# Patient Record
Sex: Male | Born: 1950 | Race: White | Hispanic: No | Marital: Married | Smoking: Former smoker
Health system: Southern US, Community
[De-identification: ages and names within clinical notes are randomized; demographics above are authoritative.]

## PROBLEM LIST (undated history)

## (undated) DIAGNOSIS — M1990 Unspecified osteoarthritis, unspecified site: Secondary | ICD-10-CM

## (undated) DIAGNOSIS — F329 Major depressive disorder, single episode, unspecified: Secondary | ICD-10-CM

## (undated) DIAGNOSIS — E785 Hyperlipidemia, unspecified: Secondary | ICD-10-CM

## (undated) DIAGNOSIS — I2510 Atherosclerotic heart disease of native coronary artery without angina pectoris: Secondary | ICD-10-CM

## (undated) DIAGNOSIS — G4730 Sleep apnea, unspecified: Secondary | ICD-10-CM

## (undated) DIAGNOSIS — R0902 Hypoxemia: Secondary | ICD-10-CM

## (undated) DIAGNOSIS — J449 Chronic obstructive pulmonary disease, unspecified: Secondary | ICD-10-CM

## (undated) DIAGNOSIS — F32A Depression, unspecified: Secondary | ICD-10-CM

## (undated) DIAGNOSIS — I10 Essential (primary) hypertension: Secondary | ICD-10-CM

## (undated) DIAGNOSIS — I509 Heart failure, unspecified: Secondary | ICD-10-CM

## (undated) DIAGNOSIS — K219 Gastro-esophageal reflux disease without esophagitis: Secondary | ICD-10-CM

## (undated) HISTORY — DX: Hypoxemia: R09.02

## (undated) HISTORY — DX: Unspecified osteoarthritis, unspecified site: M19.90

## (undated) HISTORY — DX: Gastro-esophageal reflux disease without esophagitis: K21.9

## (undated) HISTORY — DX: Atherosclerotic heart disease of native coronary artery without angina pectoris: I25.10

## (undated) HISTORY — DX: Sleep apnea, unspecified: G47.30

## (undated) HISTORY — DX: Major depressive disorder, single episode, unspecified: F32.9

## (undated) HISTORY — DX: Hemochromatosis, unspecified: E83.119

## (undated) HISTORY — DX: Essential (primary) hypertension: I10

## (undated) HISTORY — DX: Chronic obstructive pulmonary disease, unspecified: J44.9

## (undated) HISTORY — DX: Heart failure, unspecified: I50.9

## (undated) HISTORY — DX: Hyperlipidemia, unspecified: E78.5

## (undated) HISTORY — DX: Depression, unspecified: F32.A

---
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
## 1993-09-18 HISTORY — PX: BACK SURGERY: SHX140

## 2007-09-19 HISTORY — PX: CORONARY ANGIOPLASTY WITH STENT PLACEMENT: SHX49

## 2009-02-09 ENCOUNTER — Encounter: Payer: Self-pay | Admitting: Internal Medicine

## 2009-02-11 ENCOUNTER — Encounter: Payer: Self-pay | Admitting: Internal Medicine

## 2009-11-08 ENCOUNTER — Encounter: Payer: Self-pay | Admitting: Internal Medicine

## 2010-01-26 ENCOUNTER — Encounter: Payer: Self-pay | Admitting: Internal Medicine

## 2010-07-18 ENCOUNTER — Inpatient Hospital Stay (HOSPITAL_COMMUNITY): Admission: AD | Admit: 2010-07-18 | Discharge: 2010-07-22 | Payer: Self-pay | Admitting: Internal Medicine

## 2010-07-19 ENCOUNTER — Encounter (INDEPENDENT_AMBULATORY_CARE_PROVIDER_SITE_OTHER): Payer: Self-pay | Admitting: Internal Medicine

## 2010-08-02 ENCOUNTER — Encounter: Payer: Self-pay | Admitting: Internal Medicine

## 2010-08-17 ENCOUNTER — Ambulatory Visit: Payer: Self-pay | Admitting: Thoracic Surgery

## 2010-08-17 DIAGNOSIS — J449 Chronic obstructive pulmonary disease, unspecified: Secondary | ICD-10-CM

## 2010-08-17 DIAGNOSIS — J4489 Other specified chronic obstructive pulmonary disease: Secondary | ICD-10-CM | POA: Insufficient documentation

## 2010-08-17 DIAGNOSIS — J984 Other disorders of lung: Secondary | ICD-10-CM | POA: Insufficient documentation

## 2010-08-17 DIAGNOSIS — Z862 Personal history of diseases of the blood and blood-forming organs and certain disorders involving the immune mechanism: Secondary | ICD-10-CM

## 2010-08-18 ENCOUNTER — Ambulatory Visit: Payer: Self-pay | Admitting: Internal Medicine

## 2010-08-18 DIAGNOSIS — F17200 Nicotine dependence, unspecified, uncomplicated: Secondary | ICD-10-CM

## 2010-08-22 ENCOUNTER — Ambulatory Visit (HOSPITAL_COMMUNITY)
Admission: RE | Admit: 2010-08-22 | Discharge: 2010-08-22 | Payer: Self-pay | Source: Home / Self Care | Admitting: Thoracic Surgery

## 2010-08-30 ENCOUNTER — Ambulatory Visit: Payer: Self-pay | Admitting: Thoracic Surgery

## 2010-09-26 ENCOUNTER — Ambulatory Visit: Admit: 2010-09-26 | Payer: Self-pay | Admitting: Internal Medicine

## 2010-10-18 NOTE — Assessment & Plan Note (Signed)
Summary: copd ///kp   Visit Type:  Initial Consult Copy to:  Dr. Pearson Grippe Primary Provider/Referring Provider:  Dr. Pearson Grippe, [facility]  CC:  Pulmonary Consult.  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 Heavy smoker and daily drinker of beer. CAD on ACE inhibitor and ATENOLOL non specific beta blocker.   Established care with Dr. Pearson Grippe recently following admission 07/18/2010 through 07/22/2010 for AECOPD/mild diastolic CHF. Dr Selena Batten has now referrhed him here for COPD mgmt. Patient also has pulmnary nodules (likely benign, see past med) for which Dr. Edwyna Shell is following.   Patient does not know for sure why he is here with pulmonary but suspects it is for COPD. States he has chronci dyspea for few years. Insidious onset. Dyspnea since MI in 2009 and s.p stent in [place], [place]. No associated chest pain currently. Typically gets dyspneic only after walking 1/2 mile at slow pace but only 100 yards at fast pace or hard work in a 'hurry'. Has occ chronic dry cough at baseline.  Denies hemoptysis, edema, symcope.      Preventive Screening-Counseling & Management  Alcohol-Tobacco     Alcohol drinks/day: >5     Alcohol type: beer     >5/day in last 3 mos: yes     Feels need to cut down: no     Feels annoyed by complaints: yes     Feels guilty re: drinking: yes     Needs 'eye opener' in am: no     Smoking Status: current     Smoking Cessation Counseling: yes     Smoke Cessation Stage: contemplative     Packs/Day: 2.5     Year Started: 1969     Tobacco Counseling: to quit use of tobacco products  Comments: finding it tough to quit  Current Medications (verified): 1)  Nitrostat 0.4 Mg Subl (Nitroglycerin) .... As Needed 2)  Spiriva Handihaler 18 Mcg Caps (Tiotropium Bromide Monohydrate) .... Once Daily 3)  Atenolol 50 Mg Tabs (Atenolol) .... Once Daily 4)   Buspirone Hcl 15 Mg Tabs (Buspirone Hcl) .... Once Daily 5)  Aspirin 81 Mg Tabs (Aspirin) .... Once Daily 6)  Folic Acid 800 Mcg Tabs (Folic Acid) .... Once Daily 7)  Prozac 20 Mg Caps (Fluoxetine Hcl) .... Once Daily 8)  Oxygen .... 2 Liters At Bedtime 9)  Lisinopril 5 Mg Tabs (Lisinopril) .... Once Daily 10)  Xopenex 0.63 Mg/53ml Nebu (Levalbuterol Hcl) .... Once A Day Per Pt  Allergies (verified): No Known Drug Allergies  Past History:  Past Medical History: Current Problems:  #HEMOCHROMATOSIS, HX OF (ICD-V12.3)... dr Arty Baumgartner #PULMONARY NODULE [phone])....Marland KitchenMarland KitchenDr Edwyna Shell  - CT at [facility]  07/20/2008 per report/no image: 4mm RML nodule, 8mm RUL nodules  - CT at [facility] 02/09/2009 - per report/no image: same  - PET scan at [facility] 5/28/201- - per report/no image: NEGATIVE   [facility]:   8 x 8 mm diameter right upper lobe pulmonary nodule   image 41, noncalcified.         Tiny calcified granuloma subpleural right lower lobe image 48  #HILAR NODES   CT 07/18/2010 at [facility]  right hilar lymph nodes, 2.8 x 1.8 cm image 49   and 1.4 x 1.1 cm image 58. #COPD (ICD-496)  - seen on CT 07/18/2010 #CAD---POST STENT IN 2009 (ICD-414.00).Marland KitchenMarland Kitchen  Dr Lavella Hammock  #HEIGHT LOSS at [facility]  - seen on CT 07/18/2010  Past Pulmonary History:  Pulmonary History:  DATE OF ADMISSION:  07/18/2010   DATE OF DISCHARGE:  07/22/2010     1. Chronic obstructive pulmonary disease exacerbation.        - BNP 400, ECHO ef 60%, Gr 1 diastolicy dysfn  2. Lower extremity edema.   3. Coronary artery disease status post stent in 2009.   4. Chronic obstructive pulmonary disease, on home O2 at night.   5. Hemochromatosis.   6. Negative colonoscopy in 2007 per patient.   7. Back surgery in 1995, [facility].   8. Appendectomy in 1969.   Family History: Family History Hypertension---sister Family History Diabetes---brother, paternal grandparents Kidney failure---brother Father  died age 36 in MVA Family History Hyperlipidemia---sister COPD - 2 brothers  Social History: Patient is a current smoker. Started in 1969 was smokong 2.5ppd, now pt at 1/2ppd x 1 week.  Drinks 30 beers per week Pipe fitter---retired Married Moved form [place] to [place] in Fall 2011Alcohol drinks/day:  >5 Packs/Day:  2.5 >5/day in last 3 mos:  yes  Review of Systems       The patient complains of shortness of breath with activity, shortness of breath at rest, non-productive cough, indigestion, weight change, nasal congestion/difficulty breathing through nose, ear ache, depression, hand/feet swelling, joint stiffness or pain, and rash.  The patient denies productive cough, coughing up blood, chest pain, irregular heartbeats, acid heartburn, loss of appetite, abdominal pain, difficulty swallowing, sore throat, tooth/dental problems, headaches, sneezing, itching, anxiety, change in color of mucus, and fever.    Vital Signs:  Patient profile:   60 year old male Height:      71 inches Weight:      210.38 pounds BMI:     29.45 O2 Sat:      92 % on Room air Temp:     98.0 degrees F oral Pulse rate:   83 / minute BP sitting:   128 / 90  (right arm) Cuff size:   regular  Vitals Entered By: Carron Curie CMA (August 18, 2010 11:02 AM)  O2 Flow:  Room air CC: Pulmonary Consult Comments Medications reviewed with patient Carron Curie CMA  August 18, 2010 11:08 AM Daytime phone number verified with patient.    Physical Exam  General:  well developed, well nourished, in no acute distress Head:  normocephalic and atraumatic Eyes:  PERRLA/EOM intact; conjunctiva and sclera clear Ears:  TMs intact and clear with normal canals Nose:  no deformity, discharge, inflammation, or lesions Mouth:  no deformity or lesions Neck:  no masses, thyromegaly, or abnormal cervical nodes Chest Wall:  no deformities noted Lungs:  clear bilaterally to auscultation and percussion Heart:   regular rate and rhythm, S1, S2 without murmurs, rubs, gallops, or clicks Abdomen:  bowel sounds positive; abdomen soft and non-tender without masses, or organomegaly Msk:  no deformity or scoliosis noted with normal posture Pulses:  pulses normal Extremities:  no clubbing, cyanosis, edema, or deformity noted Neurologic:  CN II-XII grossly intact with normal reflexes, coordination, muscle strength and tone Skin:  intact without lesions or rashes Cervical Nodes:  no significant adenopathy Axillary Nodes:  no significant adenopathy Psych:  alert and cooperative; normal mood and affect; normal attention span and concentration   CT of Chest  Procedure date:  07/18/2010  Findings:      see past hx. I personally reviewed these scans  MISC. Report  Procedure date:  06/09/2010  Findings:      wc 9.5 hgb 17gm% creat 0.7mg %  Impression & Recommendations:  Problem # 1:  TOBACCO ABUSE (ICD-305.1) Assessment New  advised to quit. He states it will be difificult. Wil address more at followup  Orders: Prescription Created Electronically [phone]) Consultation Level V [id number])  Problem # 2:  C O P D (ICD-496) Assessment: New History, CT findingsaall shows very high pretest probabiliyt for COPD. SEe on CT scan as well.  plan get pfts to asess spirometry change non specific beta blocker atenolol to beta 1 specific lopressor change lisinopril to losartan ARB on account of chronic cough rov after pft get alpha 1 at followup contnue spiriva for now address rehab at [facility] flu shot advised (he had it already)  Medications Added to Medication List This Visit: 1)  Xopenex 0.63 Mg/11ml Nebu (Levalbuterol hcl) .... Once a day per pt 2)  Metoprolol Tartrate 25 Mg Tabs (Metoprolol tartrate) .... Take 1 tablet two times a day 3)  Metoprolol Tartrate 25 Mg Tabs (Metoprolol tartrate) .... Take 1/2  tablet two times a day 4)  Losartan Potassium 25 Mg Tabs (Losartan potassium) .... Take 1 tablet  daily  Patient Instructions: 1)  #Please STOP your ATENOLOL HEART MEDICAITON 2)     - Instead of atenolol, you take metoprolol tablet 3)  #PLEase STOP your LISINOPRIL HEART MEDICATION 4)   - Instaed of lisinopirl take cozaar heart tablet 5)  #CONTINUE ALL Your MEDICAITONS OTHERWISE 6)  #REtrun in 3-4 weeks 7)   - have full pft prior to return 8)  #CHECK YOUR BP in 2-3 days at a mall or storeand call us with results 9)    Prescriptions: METOPROLOL TARTRATE 25 MG TABS (METOPROLOL TARTRATE) take 1/2  tablet two times a day  #15 x 2   Entered and Authorized by:   Kalman Shan MD   Signed by:   Kalman Shan MD on 08/18/2010   Method used:   Print then Give to Patient   RxID:   [id number] HYQMVHQI POTASSIUM 25 MG TABS (LOSARTAN POTASSIUM) take 1 tablet daily  #30 x 2   Entered and Authorized by:   Kalman Shan MD   Signed by:   Kalman Shan MD on 08/18/2010   Method used:   Print then Give to Patient   RxID:   [id number] METOPROLOL TARTRATE 25 MG TABS (METOPROLOL TARTRATE) take 1 tablet two times a day  #60 x 1   Entered and Authorized by:   Kalman Shan MD   Signed by:   Kalman Shan MD on 08/18/2010   Method used:   Print then Give to Patient   RxID:   [id number]    Immunization History:  Influenza Immunization History:    Influenza:  historical (07/19/2010)   Appended Document: copd ///kp pls send to Dr Shon Baton who is not not with [facility] cards  Appended Document: copd ///kp Ambulatory Pulse Oximetry  Resting; HR___83__    02 Sat_94____  Lap1 (185 feet)   HR_91____   02 Sat___96__ Lap2 (185 feet)   HR__97___   02 Sat__91___    Lap3 (185 feet)   HR__98__   02 Sat__90___  ___Test Completed without Difficulty ___Test Stopped due to:   Appended Document: copd ///kp faxed ov note to Dr. Jacinto Halim at [phone].

## 2010-10-18 NOTE — Letter (Signed)
Summary: [facility]   Imported By: Sherian Rein 08/25/2010 09:10:29  _____________________________________________________________________  External Attachment:    Type:   Image     Comment:   External Document

## 2010-11-29 LAB — BLOOD GAS, ARTERIAL
Acid-Base Excess: 19.5 mmol/L — ABNORMAL HIGH (ref 0.0–2.0)
Bicarbonate: 45.7 mEq/L — ABNORMAL HIGH (ref 20.0–24.0)
O2 Content: 3 L/min
O2 Saturation: 91.3 %
Patient temperature: 98.6
Patient temperature: 98.6
TCO2: 48.1 mmol/L (ref 0–100)
pH, Arterial: 7.375 (ref 7.350–7.450)

## 2010-11-29 LAB — BASIC METABOLIC PANEL
BUN: 8 mg/dL (ref 6–23)
CO2: >45 mEq/L (ref 19–32)
Calcium: 8.6 mg/dL (ref 8.4–10.5)
Chloride: 88 mEq/L — ABNORMAL LOW (ref 96–112)
Creatinine, Ser: 0.68 mg/dL (ref 0.4–1.5)
Creatinine, Ser: 0.74 mg/dL (ref 0.4–1.5)
GFR calc Af Amer: >60 mL/min (ref >60)
GFR calc Af Amer: >60 mL/min (ref >60)
GFR calc non Af Amer: >60 mL/min (ref >60)
Potassium: 3.9 mEq/L (ref 3.5–5.1)
Sodium: 136 mEq/L (ref 135–145)

## 2010-11-29 LAB — COMPREHENSIVE METABOLIC PANEL
ALT: 19 U/L (ref 0–53)
Alkaline Phosphatase: 93 U/L (ref 39–117)
BUN: 4 mg/dL — ABNORMAL LOW (ref 6–23)
CO2: 38 mEq/L — ABNORMAL HIGH (ref 19–32)
Chloride: 87 mEq/L — ABNORMAL LOW (ref 96–112)
GFR calc non Af Amer: >60 mL/min (ref >60)
Glucose, Bld: 237 mg/dL — ABNORMAL HIGH (ref 70–99)
Potassium: 3.5 mEq/L (ref 3.5–5.1)
Total Bilirubin: 0.6 mg/dL (ref 0.3–1.2)
Total Protein: 5.7 g/dL — ABNORMAL LOW (ref 6.0–8.3)

## 2010-11-29 LAB — CBC
HCT: 47 % (ref 39.0–52.0)
Hemoglobin: 14.9 g/dL (ref 13.0–17.0)
MCV: 94.8 fL (ref 78.0–100.0)
RDW: 13 % (ref 11.5–15.5)
WBC: 9.3 10*3/uL (ref 4.0–10.5)

## 2010-11-29 LAB — CARDIAC PANEL(CRET KIN+CKTOT+MB+TROPI)
CK, MB: 7.1 ng/mL (ref 0.3–4.0)
Total CK: 105 U/L (ref 7–232)
Total CK: 121 U/L (ref 7–232)
Troponin I: 0.02 ng/mL (ref 0.00–0.06)

## 2010-11-30 LAB — CBC
Platelets: 200 10*3/uL (ref 150–400)
RBC: 5.47 MIL/uL (ref 4.22–5.81)
RDW: 13 % (ref 11.5–15.5)
WBC: 7.7 10*3/uL (ref 4.0–10.5)

## 2010-11-30 LAB — COMPREHENSIVE METABOLIC PANEL
ALT: 24 U/L (ref 0–53)
AST: 31 U/L (ref 0–37)
Albumin: 3.5 g/dL (ref 3.5–5.2)
Alkaline Phosphatase: 103 U/L (ref 39–117)
Chloride: 88 mEq/L — ABNORMAL LOW (ref 96–112)
GFR calc Af Amer: >60 mL/min (ref >60)
Potassium: 4 mEq/L (ref 3.5–5.1)
Sodium: 133 mEq/L — ABNORMAL LOW (ref 135–145)
Total Bilirubin: 0.8 mg/dL (ref 0.3–1.2)
Total Protein: 6.7 g/dL (ref 6.0–8.3)

## 2010-11-30 LAB — CARDIAC PANEL(CRET KIN+CKTOT+MB+TROPI)
Relative Index: 6 — ABNORMAL HIGH (ref 0.0–2.5)
Total CK: 155 U/L (ref 7–232)
Troponin I: 0.05 ng/mL (ref 0.00–0.06)

## 2010-11-30 LAB — BRAIN NATRIURETIC PEPTIDE: Pro B Natriuretic peptide (BNP): 414 pg/mL — ABNORMAL HIGH (ref 0.0–100.0)

## 2011-01-19 ENCOUNTER — Other Ambulatory Visit: Payer: Self-pay | Admitting: Thoracic Surgery

## 2011-01-19 DIAGNOSIS — R911 Solitary pulmonary nodule: Secondary | ICD-10-CM

## 2011-01-31 NOTE — Letter (Signed)
August 30, 2010   Massie Maroon, MD  [address]  [place], [place] [address]   Re:  JAHZEEL, POYTHRESS                 DOB:  1951/04/12   Dear Dr. Selena Batten:   I saw the patient back in the office today after his PET scan and the  nodule as expected did not show any uptake in the middle right upper  lobe.  He did have a small area of uptake and he was recommended.  He  felt it was physiologic but needs to be watched and there is a 9-mm area  in the left parotid that they thought is probably satisfactory but also  needs to be watched.  His blood pressure is 130/84, pulse 94,  respirations 18, sats were 95%.  I will just see him back again in 6  months and repeat his CT scan of his chest.   Ines Bloomer, M.D.  Electronically Signed   DPB/MEDQ  D:  08/30/2010  T:  08/31/2010  Job:  [id number]

## 2011-01-31 NOTE — Letter (Signed)
August 17, 2010   Massie Maroon, MD  [address]  [place] [address]   Re:  Isaac Flores, Isaac Flores                 DOB:  May 12, 1951   Dr. Selena Batten:   I saw the patient in the office today.  This is a 60 year old male who  was from [place], [place] and is moving down to [place].  and was admitted to the hospital with chronic obstructive pulmonary  disease exacerbation, lower extremity edema.  This was in October 2011.  His other problems include hemochromatosis, coronary artery disease with  stent 2009 and home oxygen.  He had a workup for lung nodules in 2009  which is 4-mm right middle lobe and 8-mm right upper lobe with a  negative and some questionable axillary adenopathy on PET scan.  The  lung nodules were negative.  There was some increased uptake in the  right axillary adenopathy.  He had a repeat CT scan while in the  hospital to rule out pulmonary embolus and it showed 0.8 mm right upper  lobe nodule in the posterior segment and there was mediastinal and right  hilar adenopathy.  The mediastinal adenopathy was smaller, but the right  hilar adenopathy was more significant.  He is referred here for  evaluation.   MEDICATIONS:  1. Furosemide 40 mg.  2. Xopenex.  3. Lisinopril.  4. Potassium chloride.  5. Prednisone.  6. Thiamine.  7. Ambien.  8. Aspirin.  9. Atenolol.  10.BuSpar.  11.Folic acid.  12.Spiriva.   ALLERGIES:  He has no allergies.   PAST SURGICAL HISTORY:  Appendectomy, back surgery in 1995.   FAMILY HISTORY:  Positive for diabetes and kidney failure.   SOCIAL HISTORY:  He smoked two packs a day for 40 years and drinks  several six packs a week.  He is a retired Academic librarian.  He says that he  quit smoking while he is in the hospital, but he has restarted back and  smoking at least half pack of cigarettes a day.  He is married and has 3  children.   REVIEW OF SYSTEMS:  VITAL SIGNS:  He is 207 pounds and he is 5 feet 11  inches.  HEENT:   He has had some small weight loss.  CARDIAC:  See  history of present illness.  PULMONARY:  See history of present illness.  GI:  No nausea, vomiting, constipation, or diarrhea.  GU:  Frequent  urination.  No kidney disease.  VASCULAR:  No claudication, DVT, or  TIAs.  NEUROLOGICAL:  No dizziness headaches, blackouts, or seizures.  MUSCULOSKELETAL:  Arthritis and back pain and muscular pain.  PSYCHIATRIC:  Depression or nervousness.  Eyes/ENT:  No changes in  eyesight or hearing.  HEMATOLOGICAL:  No problems with bleeding,  clotting disorders or anemia.   PHYSICAL EXAMINATION:  VITAL SIGNS:  Blood pressure 124/88, pulse 96,  respirations 18, and sats were 96%.  HEAD, EYES, EARS, NOSE, AND THROAT:  Unremarkable.  NECK:  Supple without thyromegaly.  There is no supraclavicular or  axillary adenopathy.  CHEST:  Clear to auscultation and percussion.  HEART:  Regular sinus rhythm.  No murmurs.  ABDOMEN:  Soft.  There is no hepatosplenomegaly.  EXTREMITIES:  Pulses are 2+.  There is no clubbing or edema.  NEUROLOGICAL:  He is oriented x3.  Sensory and motor intact.  Cranial  nerves intact.   I feel that he had a  somewhat concerned about the change in the  adenopathy in the mediastinum in someone with a high-risk for lung  cancer.  The lung nodule, it is hard to say without having the previous  CT scans to review, but may have gotten slightly bigger than what it was  at that time.  All nodes were negative on PET scan previously, I think  it is worth repeating this PET scan both to look at the adenopathy as  well as to reevaluate the nodule.  I will plan to get this and I will  see him back again after the PET scan.  I appreciate the opportunity of  seeing the patient.   Ines Bloomer, M.D.  Electronically Signed   DPB/MEDQ  D:  08/17/2010  T:  08/18/2010  Job:  [id number]

## 2011-03-07 ENCOUNTER — Ambulatory Visit: Payer: Self-pay | Admitting: Thoracic Surgery

## 2011-03-14 ENCOUNTER — Other Ambulatory Visit: Payer: Self-pay

## 2011-03-14 ENCOUNTER — Ambulatory Visit: Payer: Self-pay | Admitting: Thoracic Surgery

## 2011-06-06 ENCOUNTER — Ambulatory Visit (INDEPENDENT_AMBULATORY_CARE_PROVIDER_SITE_OTHER): Payer: Medicare Other | Admitting: Thoracic Surgery

## 2011-06-06 ENCOUNTER — Encounter: Payer: Self-pay | Admitting: Thoracic Surgery

## 2011-06-06 ENCOUNTER — Ambulatory Visit
Admission: RE | Admit: 2011-06-06 | Discharge: 2011-06-06 | Disposition: A | Discharge status: Home / Self Care | Payer: Medicare HMO | Source: Ambulatory Visit | Attending: Thoracic Surgery | Admitting: Thoracic Surgery

## 2011-06-06 VITALS — BP 102/69 | HR 76 | Resp 18 | Ht 71.0 in | Wt 219.0 lb

## 2011-06-06 DIAGNOSIS — J449 Chronic obstructive pulmonary disease, unspecified: Secondary | ICD-10-CM | POA: Insufficient documentation

## 2011-06-06 DIAGNOSIS — D381 Neoplasm of uncertain behavior of trachea, bronchus and lung: Secondary | ICD-10-CM

## 2011-06-06 DIAGNOSIS — R911 Solitary pulmonary nodule: Secondary | ICD-10-CM

## 2011-06-06 NOTE — Progress Notes (Signed)
HPI this patient returns for followup of a right nodule. The skin shows no change. His recent fall with nondisplaced fracture of his left rib. He required a left the chest tube for a pneumothorax. He says when he coughs  He can blackout. I suggested that he see his medical Dr. he may be having syncopal episodes. Since his nodule is stable on CT scan, I allowed them be followed by his medical Dr. There has been no change of the 8 mm nodule in over a year.   Current Outpatient Prescriptions  Medication Sig Dispense Refill  . aspirin 325 MG EC tablet Take 325 mg by mouth daily.        Marland Kitchen atenolol (TENORMIN) 50 MG tablet Take 50 mg by mouth daily.        . busPIRone (BUSPAR) 15 MG tablet Take 15 mg by mouth 1 day or 1 dose.        . folic acid (FOLVITE) 800 MCG tablet Take 400 mcg by mouth daily.        . nitroGLYCERIN (NITROSTAT) 0.4 MG SL tablet Place 0.4 mg under the tongue every 5 (five) minutes as needed.        . tiotropium (SPIRIVA HANDIHALER) 18 MCG inhalation capsule Place 18 mcg into inhaler and inhale daily.        Marland Kitchen aspirin 81 MG tablet Take 81 mg by mouth daily.        Marland Kitchen FLUoxetine (PROZAC) 20 MG capsule Take 20 mg by mouth daily.           Review of Systems: No change  Physical Exam  Constitutional: He appears well-developed and well-nourished.  Cardiovascular: Normal rate, regular rhythm, normal heart sounds and intact distal pulses.   Pulmonary/Chest: Breath sounds normal. No respiratory distress. He has no wheezes. He exhibits no tenderness.     Diagnostic Tests CT scan showed no change in the nodule in the right middle lobe:   Impression: Benign nodule right middle lobe.   Plan: Follow up with medical Dr.

## 2012-06-04 ENCOUNTER — Encounter: Payer: Self-pay | Admitting: Gastroenterology

## 2012-06-30 IMAGING — CT CT CHEST W/O CM
3 of 4 series · 15 of 30 positions shown, 16 images · non-contrast
Comparison: 04/22/2011, 07/18/2010

CLINICAL DATA: Follow-up lung lesion

CT CHEST WITHOUT CONTRAST
TECHNIQUE: Multidetector CT imaging of the chest was performed
following the standard protocol without IV contrast.

[Series 3: chest w/o · axial · non-contrast · 0.92mm/px · z∈[-203,-53]mm · 3 of 62 slices shown]
[im 16/62  lung]
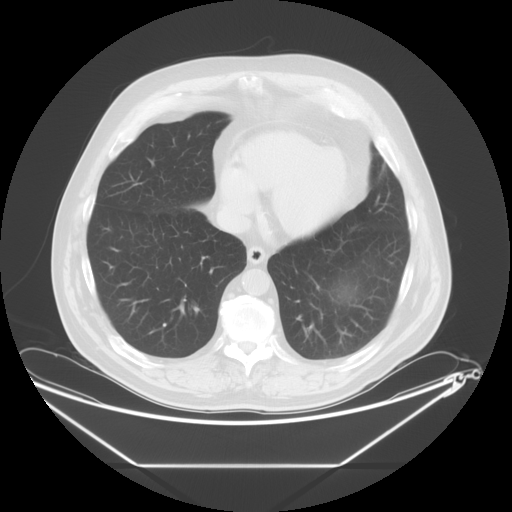
[im 31/62  lung]
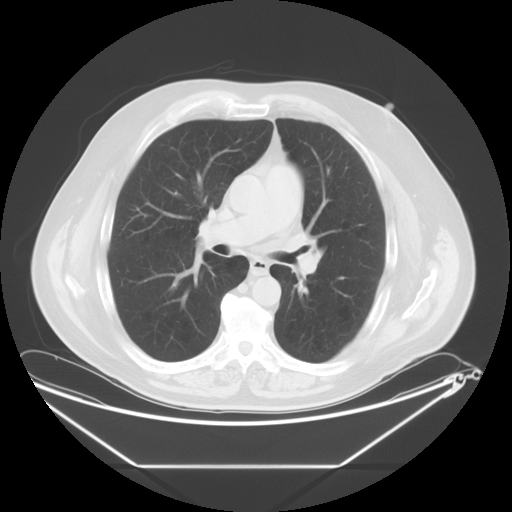
[im 46/62  lung]
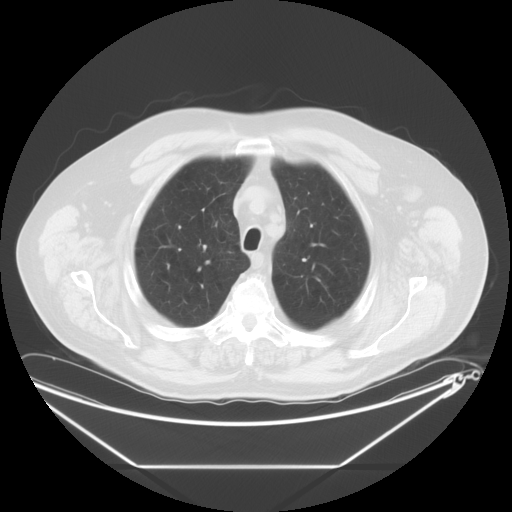

[Series 4: lung windows · axial · 0.92mm/px · z∈[-218,-38]mm · 4 of 62 slices shown, 5 images]
[im 13/62  mediastinal]
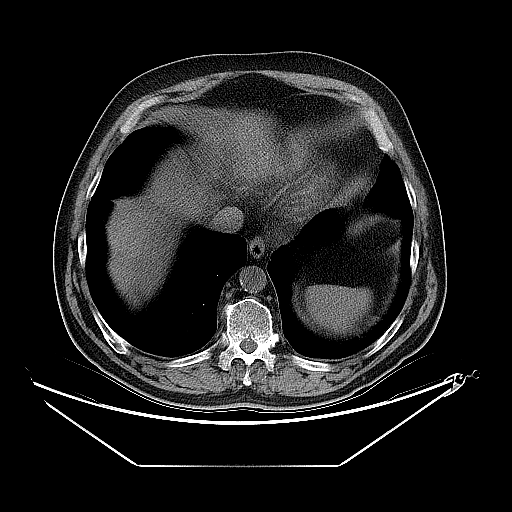
[im 13/62  lung]
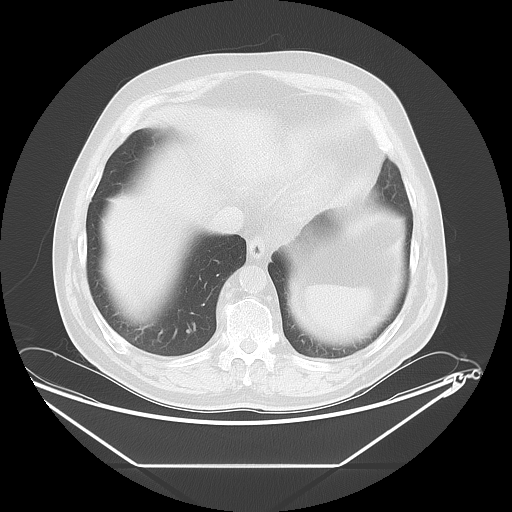
[im 25/62  lung]
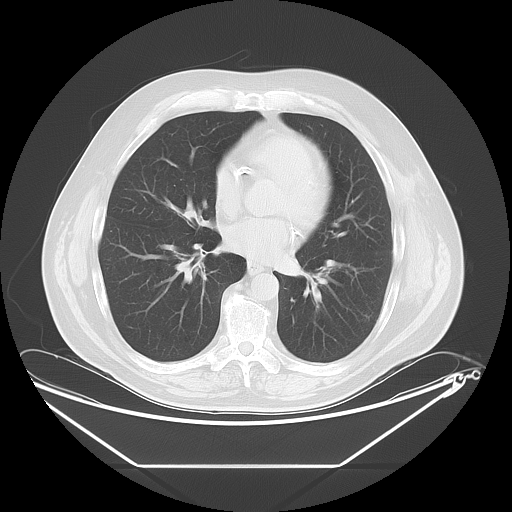
[im 37/62  lung]
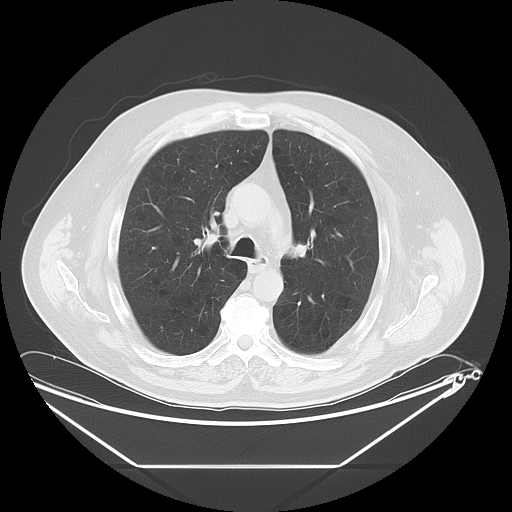
[im 49/62  lung]
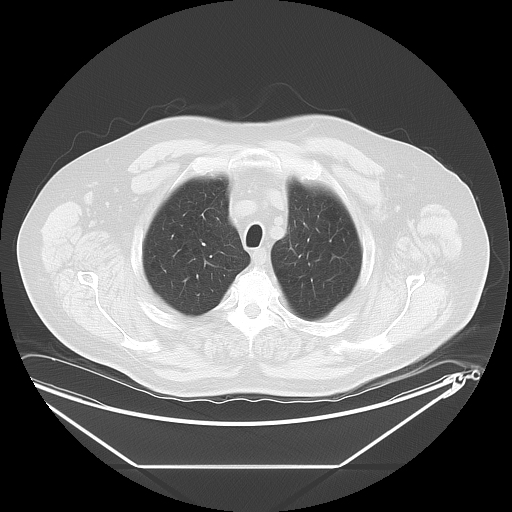

[Series 602: sagittal body · sagittal · 0.92mm/px · 8 of 188 slices shown]
[im 12/188  mediastinal]
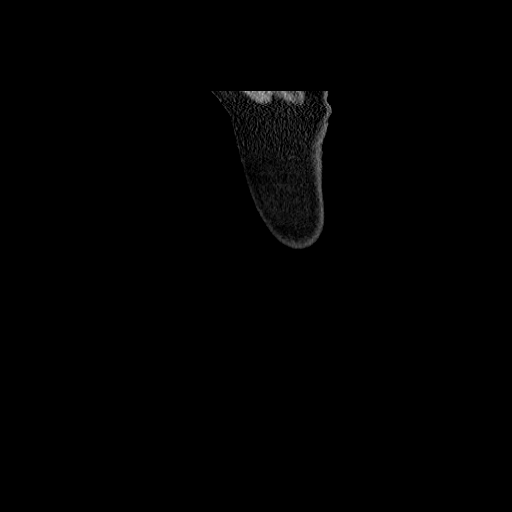
[im 36/188  mediastinal]
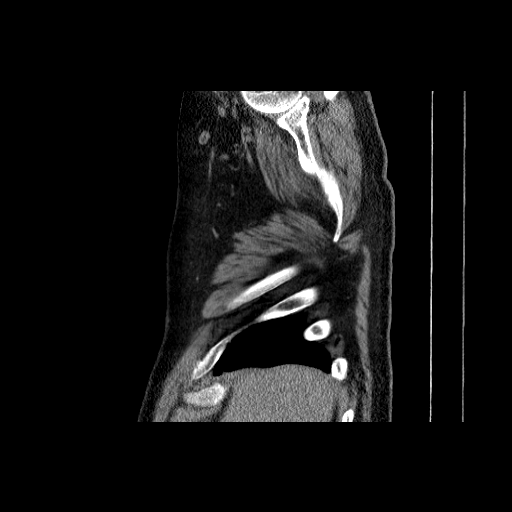
[im 59/188  mediastinal]
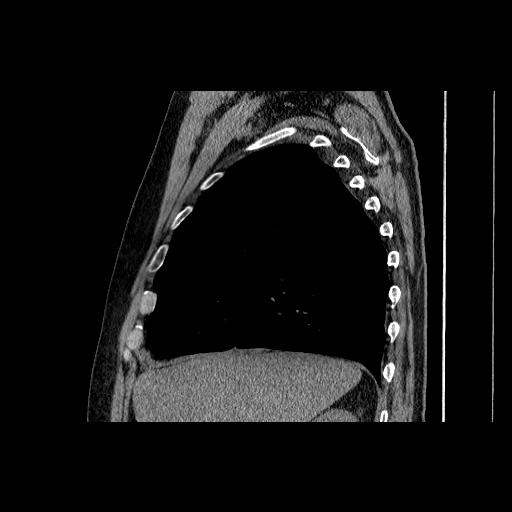
[im 82/188  mediastinal]
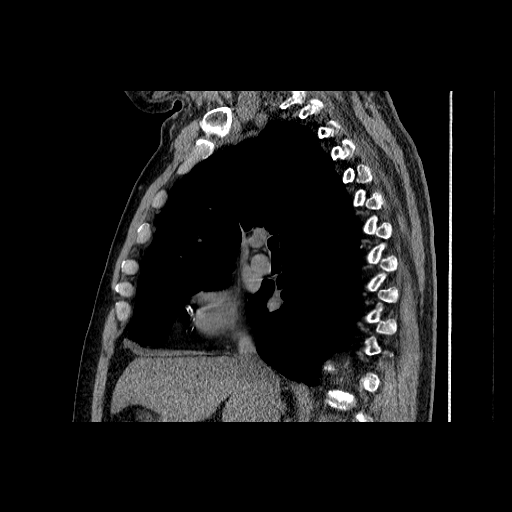
[im 106/188  mediastinal]
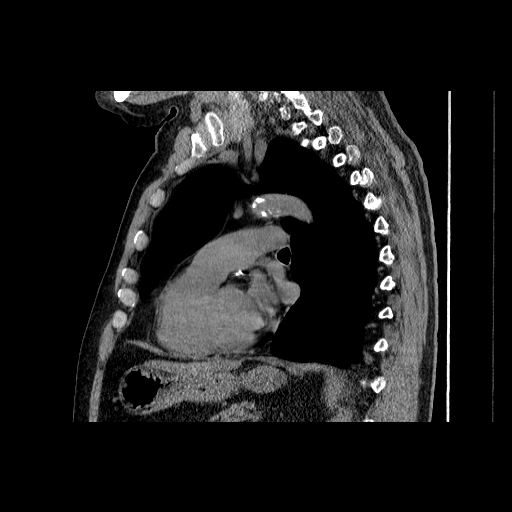
[im 129/188  mediastinal]
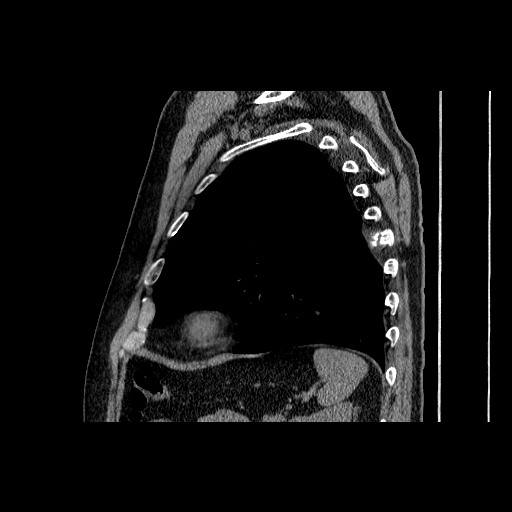
[im 152/188  mediastinal]
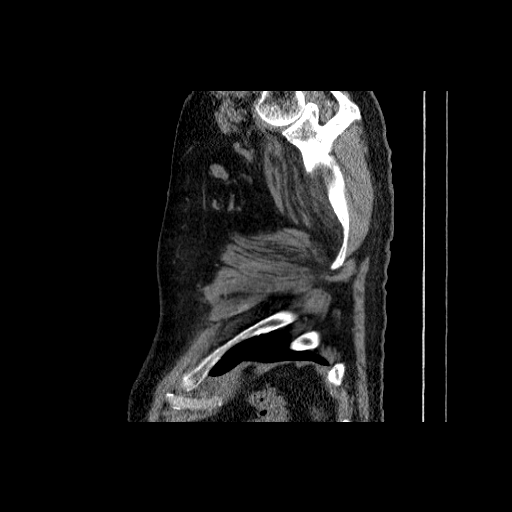
[im 176/188  mediastinal]
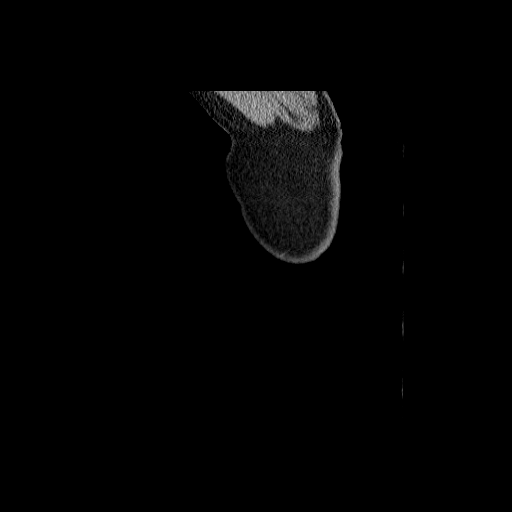

[15 of 30 positions shown; findings below may reference images not displayed]

FINDINGS: Centrilobular emphysema.  Stable 9 mm pulmonary nodule in
the right lower lobe (series 4/image 24).  No new/suspicious
pulmonary nodules.  No pleural effusion or pneumothorax.

Visualized thyroid is unremarkable.

The heart is normal in size.  No pericardial effusion.  Coronary
atherosclerosis.  Sclerotic calcifications of the aortic arch.

No suspicious mediastinal lymphadenopathy.  1.5 cm short-axis left
axillary lymph node, unchanged.

Visualized upper abdomen is notable for a hyperdense left upper
renal cysts and suspected prior vascular insult to the medial
segment left hepatic lobe.
IMPRESSION: Stable 9 mm pulmonary nodule in the right lower lobe, unchanged
from 07/18/2010.

Centrilobular emphysema.  No new/suspicious pulmonary nodules.

## 2015-09-27 ENCOUNTER — Other Ambulatory Visit: Payer: Self-pay

## 2015-09-27 DIAGNOSIS — I509 Heart failure, unspecified: Secondary | ICD-10-CM

## 2015-09-27 DIAGNOSIS — Z79899 Other long term (current) drug therapy: Secondary | ICD-10-CM

## 2015-09-27 NOTE — Patient Outreach (Addendum)
Isaac Flores) Care Management  09/27/2015  Isaac Flores 03-18-1951 [id number]  SUBJECTIVE:  Telephone call to patient regarding [facility] referral.  HIPAA verified with patient.  Patient request RNCM speak with his wife, Isaac Flores regarding any and all of his medical information. Discussed and offered patient [facility] care management services.  Patient and wife in agreement for patient to receive services.   Patient states he was in the hospital recently for congestive heart failure and pneumonia.  Wife states patient was admitted to [facility] on November 9 or 10th, 2016 discharging 1 month later. Wife states, "the doctors didn't know if he was going to make it." Wife states patient is currently receiving home health with [facility].  Wife states the nurse is following patient 1 time per week and has approximately 4 weeks remaining.  Wife states patients heart failure and pulmonary issues have just recently gotten worse. Wife states patient also has sleep apnea.   Wife states patient had a heart attack in 2009 and during that time he started having heart failure issues.  Wife denies patient having any other hospital admissions or emergency department visits within the past year.   Wife states patient is currently on  Oxygen at 3 to 3 1/2 liters continuous. States patient receiving oxygen from [place]. Wife states patient uses cane for ambulating but has a walker if needed. Wife state patient requires assistance with care. Wife states patient loss some of the use of his right arm after having the heart attack in 2009 therefore requiring assistance with his daily care.  Wife states patient has not been weighing daily. Wife states patient does have a workable scale at home.   Wife denies patient having an action plan for heart failure or knowing the signs and symptoms of heart failure.  Wife states patient is on coumadin and goes to doctors office for lab weekly.  Wife states she checks patients blood pressure occasionally.  Wife states nurse with [facility] has been checking patients blood pressure.   Medications: Wife states patient is on approximately 10 medications. Wife states hard to afford some of patients pulmonary medications. Wife states difficulty affording [facility]  Wife states patients doctor is providing samples currently.  Meals:  Wife states patients [employer] provided patient with low sodium meals after discharge from the hospital.  Wife denies patient being on a special diet currently.   ASSESSMENT: [facility] referral for congestive heart failure and recently hospitalization.  Patient states his hospitalization was for congestive heart failure and pneumonia.  Patient will benefit from [facility] care management community case manager follow up and pharmacy referral.  PLAN: RNCM will refer patient to community case manager for heart failure management/education and recent hospital discharge. RNCM will refer patient to pharmacy for polypharmacy and medication assistance.   Isaac Plowman RN,BSN,CCM [facility] Coordinator [phone]

## 2015-09-28 ENCOUNTER — Other Ambulatory Visit: Payer: Self-pay

## 2015-09-28 NOTE — Patient Outreach (Signed)
New referral from telephonic case manager: Placed call to patients number. Left a message. Placed call to wife's number. No answer.  Placed call to MD office and confirmed patient current with [facility] in [place].  Spoke with Ander Purpura who will fax medical records to [facility] office. Lauren suggest speaking with wife who is caregiver.   PLAN: Will continue to attempt outreach to patient.  Left message today requesting a call back.   [name], RN, BSN, CEN [facility] [employer] [phone]

## 2015-09-30 ENCOUNTER — Other Ambulatory Visit: Payer: Self-pay

## 2015-09-30 NOTE — Patient Outreach (Signed)
Referral:  Second attempt to reach patient.  Reviewed reason for call and offered home visit. Patient and wife accepted home visit offer. Wife reports current weight of 216 pounds. Patient was seen at primary MD office yesterday for sinus infection.   PLAN: Home visit planned for 10/11/2015.  Confirmed address and provided my contact information.

## 2015-10-11 ENCOUNTER — Other Ambulatory Visit: Payer: Self-pay

## 2015-10-11 NOTE — Patient Outreach (Signed)
Isaac Flores) Care Management  [place]   10/11/2015  MARKHAM DUMLAO 08-05-1951 [id number]  Subjective: Isaac Flores is a 65 year old male who was referred for medication review and assistance.  He was recently hospitalized for COPD.  I am completing this home visit with [name], RN, nurse care manager.  His wife provided his medication list and his medications when I had questions regarding the list.  She stated his amiodarone most likely will be stopped at the end of the month.  He is on warfarin and does get his INR monitored routinely.  His last INR was 1.9 per his wife.  His stated he never misses a dose of medication.  His medications are once or twice a day.  [name] is concerned that the Breo and Spiriva will cost a total of $300 per month.  Objective:   Current Medications: Current Outpatient Prescriptions  Medication Sig Dispense Refill  . amiodarone (PACERONE) 200 MG tablet Take 200 mg by mouth daily.    Marland Kitchen b complex vitamins tablet Take 1 tablet by mouth daily.    . citalopram (CELEXA) 20 MG tablet Take 20 mg by mouth daily.    . Fluticasone Furoate-Vilanterol (BREO ELLIPTA) 200-25 MCG/INH AEPB Inhale 1 puff into the lungs daily.    . folic acid (FOLVITE) 678 MCG tablet Take 800 mcg by mouth daily.    Marland Kitchen LORazepam (ATIVAN) 0.5 MG tablet Take 0.5 mg by mouth every 6 (six) hours as needed for anxiety.    . metoprolol (LOPRESSOR) 50 MG tablet Take 50 mg by mouth 2 (two) times daily.    . nitroGLYCERIN (NITROSTAT) 0.4 MG SL tablet Place 0.4 mg under the tongue every 5 (five) minutes as needed.      . pramipexole (MIRAPEX) 0.5 MG tablet Take 0.5 mg by mouth at bedtime.    . pravastatin (PRAVACHOL) 40 MG tablet Take 40 mg by mouth daily.    Marland Kitchen thiamine 100 MG tablet Take 100 mg by mouth daily.    Marland Kitchen tiotropium (SPIRIVA HANDIHALER) 18 MCG inhalation capsule Place 18 mcg into inhaler and inhale daily.      Marland Kitchen warfarin (COUMADIN) 5 MG tablet Take 5 mg by  mouth daily.     No current facility-administered medications for this visit.    Functional Status: In your present state of health, do you have any difficulty performing the following activities: 10/11/2015  Hearing? Y  Vision? N  Difficulty concentrating or making decisions? N  Walking or climbing stairs? Y  Dressing or bathing? Y  Doing errands, shopping? Y  Preparing Food and eating ? N  Using the Toilet? N  In the past six months, have you accidently leaked urine? N  Do you have problems with loss of bowel control? N  Managing your Medications? N  Managing your Finances? N  Housekeeping or managing your Housekeeping? N    Fall/Depression Screening: PHQ 2/9 Scores 10/11/2015  PHQ - 2 Score 4  PHQ- 9 Score 13    Assessment:  Drugs sorted by system:  Neurologic/Psychologic: citalopram, lorazepam, pramipexole  Cardiovascular: amiodarone, metoprolol, nitrostat, pravastatin, warfarin  Pulmonary/Allergy: Breo, Spiriva  Gastrointestinal: none   Endocrine: none  Renal: none  Topical: none  Pain: none  Vitamins/Minerals: Vitamin B complex, folic acid, thiamine  Infectious Diseases: none  Miscellaneous: none  Duplications in therapy: none Gaps in therapy: no ACE inhibitor or ARB - taken off lisinopril 40 mg during last hospitalization  Medications to avoid  in the elderly: lorazepam Drug interactions: amiodarone and citalopram (increased QT interval); amiodarone and warfarin (increased INR) Other issues noted: no rescue medication  Cost of Breo and Spiriva:  $47 copay each month for each medication at a local pharmacy     Plan: 1. I reviewed [name]'s medications.  I determined there was an interaction between amiodarone and citalopram.  Used together, the QT interval may be increased.  As long as EKGs were being done and the plan is to discontinued the amiodarone at the end of the month, then I think it is ok for citalopram to be continued.  If amiodarone  is going to be continued longer, I would recommended using a different SSRI.  Citalopram is the only one that interacts.  Warfarin also interacts with amiodarone but dose adjustments have been made to adjust for the interactions.  His last INR was 1.9.  2.  He is not on an ACE inhibitor or ARB currently but lisinopril was stopped during his last hospitalization.  It was unclear to me why it was stopped based on the discharged summary in chart everywhere.  3.  He does not currently have a rescue inhaler.  I will request his provider to send a prescription to the pharmacy for Ventolin which is on formulary.  4.  Breo and Spiriva can be purchased at a local pharmacy for $47 a month.  [name] stated she could afford this monthly.  They will not qualify for patient assistance due to their monthly income.    5.  I will close them out to pharmacy since all his pharmacy needs have been met.  I am happy to assist in the future if any pharmacy need arise.   [name], PharmD, [place] [phone]

## 2015-10-11 NOTE — Patient Outreach (Signed)
[facility]) Care Management   10/11/2015  [name] [birth year] [id number]  Isaac Flores is an 65 y.o. male 11:05 am Arrived for scheduled home visit. Wife [name] present and engaged during home visit.  [name], [facility] pharmacy also present for home visit. Subjective: Patient reports that his struggles are " breathing, walking and sleeping".  Patient reports that he got sick in November and was in the hospital for atleast 3 weeks with difficulty breathing. Wife reports that patient was on oxygen at night but now is on oxygen 24 hours per day at 3 liters nasal canula. Supplied by [name]. Wife reports portable oxygen difficult to manage because tank is so heavy. Wife reports MD office has tried to get oxygen problem resolved without success.   Reports that he is starting to regain his strength. Patient reports that he has stopped smoking and drinking since hospitalization. Wife is in control of medications, finances and the general house hold. Wife assist patient with meal preparation, washing his back and any other needs like transportation.  Patient reports that [place] nurse comes once a week for 3 more weeks. Reports physical therapy is finished.  Patient reports that he has had weakness to the right arm since his heart attack in 2009. Reports right arm does not work right. He is unable to rotate arm and  Has difficulty washing his hair.  Patient reports daily weighing but not recording. Reports no rescue inhaler. Patient has pending cardiology appointment 10/19/2015 with Dr. [name] [facility] cardiology in [facility]. Patient reports pending pulmonary appointment on 11/13/2015 with Dr. [name].   Objective:  Home environment:  Dog and throw tugs noted in the home. ( tripping hazard)  Home is neat and clean. Wife supportive.  Patient is awake and alert. Sitting in his chair. No respiratory distress. Respirations even and unlabored.  Talking in complete sentences.  Ambulates  without cane today. Filed Vitals:   10/11/15 1121  BP: 124/62  Pulse: 63  Resp: 18  Height: 1.778 m (5\' 10" )  Weight: 214 lb (97.07 kg)  SpO2: 93%   Review of Systems  Constitutional: Positive for weight loss.       Had a 10 pound weight loss when admitted. Patient and wife report weight is back to normal.   Reports decrease in energy level and not sleeping well at night.  HENT: Negative.   Eyes: Negative.   Respiratory: Positive for shortness of breath. Negative for cough.        Reports shortness of breath with walking a distance  Cardiovascular: Positive for chest pain. Negative for orthopnea and leg swelling.       Reports chest pain for 6 weeks. Pain with movement and worse at night.  Gastrointestinal: Negative.   Genitourinary: Negative.   Musculoskeletal: Positive for falls.       Reports history of many falls in the last year.  Skin: Negative.   Neurological: Positive for focal weakness.       Reports right arm weakness since 2009. Unchanged since 2009  Endo/Heme/Allergies: Bruises/bleeds easily.  Psychiatric/Behavioral: Positive for depression. The patient is nervous/anxious and has insomnia.     Physical Exam  Constitutional: He is oriented to person, place, and time. He appears well-developed and well-nourished.  Cardiovascular: Normal rate and normal heart sounds.   Respiratory: Effort normal and breath sounds normal.  Lungs clear  GI: Soft. Bowel sounds are normal.  Musculoskeletal: He exhibits no edema.  2nd and 3rd toes  are joined together on both feet. Patient refers this as his "TWIN TOES"  Neurological: He is alert and oriented to person, place, and time.  Skin: Skin is warm and dry.  Both feet without lesions or broken skin    Current Medications:   Current Outpatient Prescriptions  Medication Sig Dispense Refill  . amiodarone (PACERONE) 200 MG tablet Take 200 mg by mouth daily.    Marland Kitchen b complex vitamins tablet Take 1 tablet by mouth daily.    .  citalopram (CELEXA) 20 MG tablet Take 20 mg by mouth daily.    . Fluticasone Furoate-Vilanterol (BREO ELLIPTA) 200-25 MCG/INH AEPB Inhale 1 puff into the lungs daily.    . folic acid (FOLVITE) Q000111Q MCG tablet Take 800 mcg by mouth daily.    Marland Kitchen LORazepam (ATIVAN) 0.5 MG tablet Take 0.5 mg by mouth every 6 (six) hours as needed for anxiety.    . metoprolol (LOPRESSOR) 50 MG tablet Take 50 mg by mouth 2 (two) times daily.    . nitroGLYCERIN (NITROSTAT) 0.4 MG SL tablet Place 0.4 mg under the tongue every 5 (five) minutes as needed.      . pramipexole (MIRAPEX) 0.5 MG tablet Take 0.5 mg by mouth at bedtime.    . pravastatin (PRAVACHOL) 40 MG tablet Take 40 mg by mouth daily.    Marland Kitchen thiamine 100 MG tablet Take 100 mg by mouth daily.    Marland Kitchen tiotropium (SPIRIVA HANDIHALER) 18 MCG inhalation capsule Place 18 mcg into inhaler and inhale daily.      Marland Kitchen warfarin (COUMADIN) 5 MG tablet Take 5 mg by mouth daily.     No current facility-administered medications for this visit.    Functional Status:   In your present state of health, do you have any difficulty performing the following activities: 10/11/2015  Hearing? Y  Vision? N  Difficulty concentrating or making decisions? N  Walking or climbing stairs? Y  Dressing or bathing? Y  Doing errands, shopping? Y  Preparing Food and eating ? N  Using the Toilet? N  In the past six months, have you accidently leaked urine? N  Do you have problems with loss of bowel control? N  Managing your Medications? N  Managing your Finances? N  Housekeeping or managing your Housekeeping? N    Fall/Depression Screening:    PHQ 2/9 Scores 10/11/2015  PHQ - 2 Score 4  PHQ- 9 Score 13   Fall Risk  10/11/2015  Falls in the past year? Yes  Number falls in past yr: 2 or more  Injury with Fall? Yes  Risk Factor Category  High Fall Risk  Risk for fall due to : History of fall(s);Impaired balance/gait  Follow up Falls prevention discussed    Assessment:   (1)Explained  THN program, Provided new patient packet. Provided [facility] calendar, magnet and my contact information. Answered all questions. Non discrimination handout provided. Consent obtained. (2) No advanced directives (3) fall risk (4) Positive depression screening. (5) CHF daily management. (6) COPD daily management. (7) medication review  (8) Oxygen delivery concern. Patient and wife report portable tank is too heavy. Reports that MD has sent in order to change to smaller tank. Wife reports [name] has not contacted the patient.   Plan:  (1) [place].  (2) provided advanced directive packet and encouraged patient to complete. (3) discussed fall risk with patient and encouraged patient to use cane. Also encouraged patient to change positions slowly to avoid dizziness. (4) Patient reports  that he is currently taking his meds as prescribed.. Will send MD this note. Patient is not interested in therapist at this time and does not want any other medications. (5) Reviewed heart failure zones and heart failure management. Encouraged patient to follow low salt diet.  Low salt tear off handout provided. Encouraged patient to record daily weights in [facility] calendar. (6) Reviewed COPD zones and when to get medical attention. COPD EMMI education provided. (7) medications reviewed by [facility] pharmacist, [name] who will inform MD of findings and assist patient with questions and concerns. (8) Will contact [employer] about patients need for smaller portable tank.  Next home visit planned for February 20th. 2017  Collaborative care planning and goal setting during home visit. Priority goal is to prevent falls.  THN CM Care Plan Problem One        Most Recent Value   Care Plan Problem One  Increased risk for falls due to history of falls.   Role Documenting the Problem One  Care Management [place] for Problem One  Active   THN Long Term Goal (31-90 days)  Patient will report no falls in the  next 31 days.   THN Long Term Goal Start Date  10/11/15   Interventions for Problem One Long Term Goal  Reviewed fall risk,  made suggestions to wear non slips socks, remove rugs, and stand for a minute prior to walking.    THN CM Short Term Goal #1 (0-30 days)  Patient will review safety handout in the next 30 days. How to prevent falls.   THN CM Short Term Goal #1 Start Date  10/11/15   Interventions for Short Term Goal #1  Provided handout and enocuraged patient to read information    [facility] CM Care Plan Problem Two        Most Recent Value   Care Plan Problem Two  Knowledge deficit related to heart failure as evidence by lack of understanding of need to record weights   Role Documenting the Problem Two  Care Management [place] for Problem Two  Active   THN CM Short Term Goal #1 (0-30 days)  Patient will report recording weights in [facility] calendar daily for the next 30 days   THN CM Short Term Goal #1 Start Date  10/11/15   Interventions for Short Term Goal #2   Provided low salt diet tear off poster. Reviewed know before you go handout. Encouraged patient to weigh daily and call MD for weight gain greater than 2-3 pounds overnight or 5 pounds in a week. reviewed with patient heart failure zones and ways to assess for edema.    [facility] CM Care Plan Problem Three        Most Recent Value   Care Plan Problem Three  Knowledge deficit related to COPD   Role Documenting the Problem Three  Care Management Coordinator   Care Plan for Problem Three  Active   THN CM Short Term Goal #1 (0-30 days)  Patient will report no admission related to lung problems in the next 30 days   THN CM Short Term Goal #1 Start Date  10/11/15   Interventions for Short Term Goal #1  Reviewed COPD zones. Reviewed importance of taking all meds as prescribed. Discussed importance of calling MD when he develops symptoms.Pharmacist to assist with understanding of medications for COPD.        [name], RN, BSN,  CEN [facility]  Coordinator [phone]

## 2015-10-13 ENCOUNTER — Other Ambulatory Visit: Payer: Self-pay

## 2015-10-13 NOTE — Patient Outreach (Signed)
Care Coordination:  [id number] am   Placed call to [place]. Spoke with Carlyon Shadow who reports order was received on 09/02/2015 but order was incomplete. Reports 3 attempts to fax MD with no response. I ask for Darlene to refax request to MD.   [id number] Spoke with Ander Purpura at [facility] in [place] and reports information above.  Lauren was able to get fax information that is needed and is refaxing order to [place].  3:00pm  Placed call to patient to inform or information above and informed patient to expect a phone call from [place].  Patient thankful for assistance. States he wished he would have had the portable tank today when he went to MD appointment.  Plan: will continue to follow up on equipment delivery.   [name], RN, BSN, CEN [facility] [employer] [phone]

## 2015-11-08 ENCOUNTER — Other Ambulatory Visit: Payer: Self-pay

## 2015-11-08 NOTE — Patient Outreach (Signed)
[place] [facility]) Care Management   11/08/2015  Isaac Flores [birth year] [id number]  Isaac Flores is an 65 y.o. male 11:00 Arrived for home visit. Patient answered door without problems. Subjective: Patient reports that he is doing better with his breathing. States that he wears his oxygen most of the time. Reports that he is eating and sleeping well. States that he has an increased appetite due to not smoking or drinking alcohol anymore.  Reports that he feels like he is more active and is trying to walk on a regular basis.  Wife reports that patient has an appointment to have his coumadin level checked today. Reports coumadin is adjusted by primary MD based on his readings.  Wife and patient reports that Isaac Flores has not called him to complete respiratory assessment for smaller portable oxygen canisters or concentrator.  Objective:  Ambulating without difficulty. When I arrived to home visit patient was not wearing his oxygen. Initial pulse ox without oxygen was 74%. When oxygen applied oxygen pulse ox reading 91-93%. Patient denies shortness of breath without oxygen on.  Filed Vitals:   11/08/15 1107  BP: 138/80  Pulse: 77  Resp: 18  Weight: 226 lb 9.6 oz (102.785 kg)  SpO2: 93%   Review of Systems  Constitutional: Negative.   HENT: Negative.   Eyes: Negative.   Respiratory: Negative.   Cardiovascular: Negative.   Gastrointestinal: Negative.   Genitourinary: Negative.   Musculoskeletal: Negative.   Skin: Negative.   Neurological: Negative.   Endo/Heme/Allergies: Negative.   Psychiatric/Behavioral: Negative.     Physical Exam  Constitutional: He is oriented to person, place, and time. He appears well-developed and well-nourished.  Cardiovascular: Normal rate.   Respiratory: Effort normal and breath sounds normal.  Lungs clear  GI: Soft. Bowel sounds are normal.  Musculoskeletal: Normal range of motion. He exhibits no edema.  Neurological: He is alert and  oriented to person, place, and time.  Skin: Skin is warm and dry.  Psychiatric: He has a normal mood and affect. His behavior is normal. Judgment and thought content normal.    Current Medications:   Current Outpatient Prescriptions  Medication Sig Dispense Refill  . amiodarone (PACERONE) 200 MG tablet Take 200 mg by mouth daily.    Marland Kitchen b complex vitamins tablet Take 1 tablet by mouth daily.    . citalopram (CELEXA) 20 MG tablet Take 20 mg by mouth daily.    . Fluticasone Furoate-Vilanterol (BREO ELLIPTA) 200-25 MCG/INH AEPB Inhale 1 puff into the lungs daily.    . folic acid (FOLVITE) 185 MCG tablet Take 800 mcg by mouth daily.    Marland Kitchen LORazepam (ATIVAN) 0.5 MG tablet Take 0.5 mg by mouth every 6 (six) hours as needed for anxiety.    . metoprolol (LOPRESSOR) 50 MG tablet Take 50 mg by mouth 2 (two) times daily.    . nitroGLYCERIN (NITROSTAT) 0.4 MG SL tablet Place 0.4 mg under the tongue every 5 (five) minutes as needed.      . pramipexole (MIRAPEX) 0.5 MG tablet Take 0.5 mg by mouth at bedtime.    . pravastatin (PRAVACHOL) 40 MG tablet Take 40 mg by mouth daily.    Marland Kitchen thiamine 100 MG tablet Take 100 mg by mouth daily.    Marland Kitchen tiotropium (SPIRIVA HANDIHALER) 18 MCG inhalation capsule Place 18 mcg into inhaler and inhale daily.      Marland Kitchen warfarin (COUMADIN) 5 MG tablet Take 5 mg by mouth daily.     No current facility-administered  medications for this visit.    Functional Status:   In your present state of health, do you have any difficulty performing the following activities: 10/11/2015  Hearing? Y  Vision? N  Difficulty concentrating or making decisions? N  Walking or climbing stairs? Y  Dressing or bathing? Y  Doing errands, shopping? Y  Preparing Food and eating ? N  Using the Toilet? N  In the past six months, have you accidently leaked urine? N  Do you have problems with loss of bowel control? N  Managing your Medications? N  Managing your Finances? N  Housekeeping or managing your  Housekeeping? N    Fall/Depression Screening:    PHQ 2/9 Scores 10/11/2015  PHQ - 2 Score 4  PHQ- 9 Score 13    Assessment:   (1) Patient reports that he has not heard from [place] about portable oxygen. (2) denies any new problems or concerns today.  Plan:  (1) Will place a follow up call to MD office and to [employer] to attempt to figure out problem.  (2) discussed patients upcoming goals and patient denies any new goals to work on at this time. Reports he does not need any more home visits.  Discussed plan of care with patient and wife and at this point I will continue to assist with securing appropriate portable oxygen. Then will close case. I reviewed with patient and wife that I would call them with an update within a few days.  Will plan case closure once portable oxygen is secured and patient is comfortable with new system. THN CM Care Plan Problem One        Most Recent Value   Care Plan Problem One  Increased risk for falls due to history of falls.   Role Documenting the Problem One  Care Management [place] for Problem One  Active   THN Long Term Goal (31-90 days)  Patient will report no falls in the next 31 days.   THN Long Term Goal Start Date  10/11/15   Interventions for Problem One Long Term Goal  Reviewed fall risk,  made suggestions to wear non slips socks, remove rugs, and stand for a minute prior to walking.    THN CM Short Term Goal #1 (0-30 days)  Patient will review safety handout in the next 30 days. How to prevent falls.   THN CM Short Term Goal #1 Start Date  10/11/15   [facility] CM Short Term Goal #1 Met Date  11/08/15 Barrie Folk met]   Interventions for Short Term Goal #1  Provided handout and enocuraged patient to read information    [facility] CM Care Plan Problem Two        Most Recent Value   Care Plan Problem Two  Knowledge deficit related to heart failure as evidence by lack of understanding of need to record weights   Role Documenting the Problem Two  Care  Management [place] for Problem Two  Active   THN CM Short Term Goal #1 (0-30 days)  Patient will report recording weights in [facility] calendar daily for the next 30 days   THN CM Short Term Goal #1 Start Date  10/11/15   [facility] CM Short Term Goal #1 Met Date   11/08/15 [goal not met. recorded 11 days.]   Interventions for Short Term Goal #2   Provided low salt diet tear off poster. Reviewed know before you go handout. Encouraged patient to weigh daily and  call MD for weight gain greater than 2-3 pounds overnight or 5 pounds in a week. reviewed with patient heart failure zones and ways to assess for edema.    THN CM Care Plan Problem Three        Most Recent Value   Care Plan Problem Three  Knowledge deficit related to COPD   Role Documenting the Problem Three  Care Management Coordinator   Care Plan for Problem Three  Active   THN CM Short Term Goal #1 (0-30 days)  Patient will report no admission related to lung problems in the next 30 days   THN CM Short Term Goal #1 Start Date  10/11/15   [facility] CM Short Term Goal #1 Met Date  11/08/15 [goal met]   Interventions for Short Term Goal #1  Reviewed COPD zones. Reviewed importance of taking all meds as prescribed. Discussed importance of calling MD when he develops symptoms.Pharmacist to assist with understanding of medications for COPD.        [name], RN, BSN, CEN [facility] [employer] [phone]

## 2015-11-15 ENCOUNTER — Other Ambulatory Visit: Payer: Self-pay

## 2015-11-15 NOTE — Patient Outreach (Signed)
Care Coordination/case closure  0830: Placed call to [employer] to inquire about portable oxygen. Cassandra at [name] states that smaller pportable oxygen tanks were delivered and signed by patient on 10/20/2015.  It was reported that patient had no pending orders at this time.  0900: Placed call to patient and spoke with wife. Reviewed finding from previous phone call and wife reports that they do have portable tanks but thought they were getting a concentrator to hook up to adapter in car.  Reviewed with wife that if she felt patient needed this then she would have to discuss with MD because that is not what MD ordered. Wife states that maybe she mis understood.  Wife will contact MD if she decides it is important for patient. Wife denies any new needs since last weeks home visit.   Plan: Will continue to close case as all goals are met. Wife or patient to call me in future is patient has needs. Will mail a case closed letter to patient and will inform MD via fax. Will send an in basket message to MD.  [name], RN, BSN, CEN [place] Coordinator [phone]

## 2016-01-17 DEATH — deceased
